# Patient Record
Sex: Male | Born: 1980 | Race: Asian | Hispanic: No | Marital: Single | Smoking: Former smoker
Health system: Southern US, Community
[De-identification: ages and names within clinical notes are randomized; demographics above are authoritative.]

## PROBLEM LIST (undated history)

## (undated) DIAGNOSIS — I639 Cerebral infarction, unspecified: Secondary | ICD-10-CM

## (undated) HISTORY — PX: NO PAST SURGERIES: SHX2092

---

## 2018-07-10 ENCOUNTER — Emergency Department (HOSPITAL_BASED_OUTPATIENT_CLINIC_OR_DEPARTMENT_OTHER): Payer: Self-pay

## 2018-07-10 ENCOUNTER — Inpatient Hospital Stay (HOSPITAL_BASED_OUTPATIENT_CLINIC_OR_DEPARTMENT_OTHER)
Admission: EM | Admit: 2018-07-10 | Discharge: 2018-07-11 | DRG: 065 | Disposition: A | Discharge status: Home / Self Care | Payer: Self-pay | Attending: Internal Medicine | Admitting: Internal Medicine

## 2018-07-10 ENCOUNTER — Other Ambulatory Visit: Payer: Self-pay

## 2018-07-10 ENCOUNTER — Encounter (HOSPITAL_BASED_OUTPATIENT_CLINIC_OR_DEPARTMENT_OTHER): Payer: Self-pay | Admitting: *Deleted

## 2018-07-10 ENCOUNTER — Inpatient Hospital Stay (HOSPITAL_COMMUNITY): Payer: Self-pay

## 2018-07-10 DIAGNOSIS — I63541 Cerebral infarction due to unspecified occlusion or stenosis of right cerebellar artery: Principal | ICD-10-CM | POA: Diagnosis present

## 2018-07-10 DIAGNOSIS — I10 Essential (primary) hypertension: Secondary | ICD-10-CM | POA: Diagnosis present

## 2018-07-10 DIAGNOSIS — E785 Hyperlipidemia, unspecified: Secondary | ICD-10-CM | POA: Diagnosis present

## 2018-07-10 DIAGNOSIS — R4781 Slurred speech: Secondary | ICD-10-CM | POA: Diagnosis present

## 2018-07-10 DIAGNOSIS — G8193 Hemiplegia, unspecified affecting right nondominant side: Secondary | ICD-10-CM | POA: Diagnosis present

## 2018-07-10 DIAGNOSIS — F1510 Other stimulant abuse, uncomplicated: Secondary | ICD-10-CM | POA: Diagnosis present

## 2018-07-10 DIAGNOSIS — F17210 Nicotine dependence, cigarettes, uncomplicated: Secondary | ICD-10-CM | POA: Diagnosis present

## 2018-07-10 DIAGNOSIS — I639 Cerebral infarction, unspecified: Secondary | ICD-10-CM

## 2018-07-10 DIAGNOSIS — R29702 NIHSS score 2: Secondary | ICD-10-CM | POA: Diagnosis present

## 2018-07-10 HISTORY — DX: Cerebral infarction, unspecified: I63.9

## 2018-07-10 LAB — CBC WITH DIFFERENTIAL/PLATELET
Abs Immature Granulocytes: 0.01 10*3/uL (ref 0.00–0.07)
Basophils Absolute: 0.1 10*3/uL (ref 0.0–0.1)
Basophils Relative: 1 %
Eosinophils Absolute: 0.2 10*3/uL (ref 0.0–0.5)
Eosinophils Relative: 3 %
HEMATOCRIT: 40.7 % (ref 39.0–52.0)
Hemoglobin: 12.7 g/dL — ABNORMAL LOW (ref 13.0–17.0)
Immature Granulocytes: 0 %
LYMPHS ABS: 1.6 10*3/uL (ref 0.7–4.0)
Lymphocytes Relative: 23 %
MCH: 24.4 pg — ABNORMAL LOW (ref 26.0–34.0)
MCHC: 31.2 g/dL (ref 30.0–36.0)
MCV: 78.3 fL — AB (ref 80.0–100.0)
Monocytes Absolute: 0.6 10*3/uL (ref 0.1–1.0)
Monocytes Relative: 9 %
Neutro Abs: 4.4 10*3/uL (ref 1.7–7.7)
Neutrophils Relative %: 64 %
Platelets: 336 10*3/uL (ref 150–400)
RBC: 5.2 MIL/uL (ref 4.22–5.81)
RDW: 13.8 % (ref 11.5–15.5)
WBC: 6.9 10*3/uL (ref 4.0–10.5)
nRBC: 0 % (ref 0.0–0.2)

## 2018-07-10 LAB — COMPREHENSIVE METABOLIC PANEL
ALT: 51 U/L — ABNORMAL HIGH (ref 0–44)
AST: 33 U/L (ref 15–41)
Albumin: 3.8 g/dL (ref 3.5–5.0)
Alkaline Phosphatase: 55 U/L (ref 38–126)
Anion gap: 7 (ref 5–15)
BILIRUBIN TOTAL: 0.6 mg/dL (ref 0.3–1.2)
BUN: 24 mg/dL — ABNORMAL HIGH (ref 6–20)
CO2: 25 mmol/L (ref 22–32)
Calcium: 8.3 mg/dL — ABNORMAL LOW (ref 8.9–10.3)
Chloride: 104 mmol/L (ref 98–111)
Creatinine, Ser: 1.25 mg/dL — ABNORMAL HIGH (ref 0.61–1.24)
GFR calc Af Amer: >60 mL/min (ref >60)
GFR calc non Af Amer: >60 mL/min (ref >60)
Glucose, Bld: 99 mg/dL (ref 70–99)
Potassium: 3.8 mmol/L (ref 3.5–5.1)
Sodium: 136 mmol/L (ref 135–145)
TOTAL PROTEIN: 7.5 g/dL (ref 6.5–8.1)

## 2018-07-10 LAB — TROPONIN I: Troponin I: <0.03 ng/mL (ref <0.03)

## 2018-07-10 LAB — ANTITHROMBIN III: AntiThromb III Func: 102 % (ref 75–120)

## 2018-07-10 MED ORDER — ASPIRIN 300 MG RE SUPP: 300.0000 mg | Freq: Every day | RECTAL | Status: DC

## 2018-07-10 MED ORDER — ACETAMINOPHEN 325 MG PO TABS: 650.0000 mg | ORAL_TABLET | ORAL | Status: DC | PRN

## 2018-07-10 MED ORDER — ASPIRIN 325 MG PO TABS
325.0000 mg | ORAL_TABLET | Freq: Every day | ORAL | Status: DC
  Administered 2018-07-10 – 2018-07-11 (×2): 325 mg via ORAL
  Filled 2018-07-10 (×2): qty 1

## 2018-07-10 MED ORDER — ENOXAPARIN SODIUM 40 MG/0.4ML ~~LOC~~ SOLN
40.0000 mg | SUBCUTANEOUS | Status: DC
  Administered 2018-07-10: 40 mg via SUBCUTANEOUS
  Filled 2018-07-10: qty 0.4

## 2018-07-10 MED ORDER — ACETAMINOPHEN 650 MG RE SUPP: 650.0000 mg | RECTAL | Status: DC | PRN

## 2018-07-10 MED ORDER — SENNOSIDES-DOCUSATE SODIUM 8.6-50 MG PO TABS: 1.0000 | ORAL_TABLET | Freq: Every evening | ORAL | Status: DC | PRN

## 2018-07-10 MED ORDER — IOPAMIDOL (ISOVUE-370) INJECTION 76%
INTRAVENOUS | Status: AC
  Filled 2018-07-10: qty 100

## 2018-07-10 MED ORDER — ASPIRIN 81 MG PO CHEW
324.0000 mg | CHEWABLE_TABLET | Freq: Once | ORAL | Status: AC
  Administered 2018-07-10: 324 mg via ORAL
  Filled 2018-07-10: qty 4

## 2018-07-10 MED ORDER — ATORVASTATIN CALCIUM 80 MG PO TABS
80.0000 mg | ORAL_TABLET | Freq: Every day | ORAL | Status: DC
  Administered 2018-07-10: 80 mg via ORAL
  Filled 2018-07-10: qty 1

## 2018-07-10 MED ORDER — IOPAMIDOL (ISOVUE-370) INJECTION 76%
100.0000 mL | Freq: Once | INTRAVENOUS | Status: AC | PRN
  Administered 2018-07-10: 75 mL via INTRAVENOUS

## 2018-07-10 MED ORDER — ACETAMINOPHEN 160 MG/5ML PO SOLN: 650.0000 mg | ORAL | Status: DC | PRN

## 2018-07-10 MED ORDER — STROKE: EARLY STAGES OF RECOVERY BOOK
Freq: Once | Status: AC
  Administered 2018-07-10: 20:00:00
  Filled 2018-07-10: qty 1

## 2018-07-10 NOTE — ED Provider Notes (Signed)
[facility] EMERGENCY DEPARTMENT Provider Note   CSN: [id number] Arrival date & time: 07/10/18  1314     History   Chief Complaint Chief Complaint  Patient presents with  . Numbness    HPI Lavontay Kirk is a 37 y.o. male.  Pt presents to the ED today with right sided weakness and headache.  The pt initially developed a headache, slurred speech, and right sided weakness about 2 weeks ago.  The pt said speech improved and the right sided weakness has improved, but it is still not back to normal.  Pt does not have a pcp and has not seen the doctor in years.  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  This is what finally prompted him to come into the ED.     History reviewed. No pertinent past medical history.  There are no active problems to display for this patient.   History reviewed. No pertinent surgical history.      Home Medications    Prior to Admission medications   Not on File    Family History No family history on file.  Social History Social History   Tobacco Use  . Smoking status: Never Smoker  Substance Use Topics  . Alcohol use: Not Currently  . Drug use: Not Currently     Allergies   Patient has no known allergies.   Review of Systems Review of Systems  Neurological:       Slurred speech, right arm weakness  All other systems reviewed and are negative.    Physical Exam Updated Vital Signs BP 120/89   Pulse 87   Temp 98.3 F (36.8 C) (Oral)   Resp (!) 22   Ht 5\' 7"  (1.702 m)   Wt 52.2 kg   SpO2 100%   BMI 18.01 kg/m   Physical Exam  Constitutional: He is oriented to person, place, and time. He appears well-developed and well-nourished.  HENT:  Head: Normocephalic and atraumatic.  Right Ear: External ear normal.  Left Ear: External ear normal.  Nose: Nose normal.  Mouth/Throat: Oropharynx is clear and moist.  Eyes: Pupils are equal, round, and reactive to light.  Conjunctivae and EOM are normal.  Neck: Normal range of motion. Neck supple.  Cardiovascular: Normal rate, regular rhythm, normal heart sounds and intact distal pulses.  Pulmonary/Chest: Effort normal and breath sounds normal.  Abdominal: Soft. Bowel sounds are normal.  Musculoskeletal: Normal range of motion.  Neurological: He is alert and oriented to person, place, and time.  Skin: Skin is warm. Capillary refill takes less than 2 seconds.  Psychiatric: He has a normal mood and affect. His behavior is normal. Judgment and thought content normal.  Nursing note and vitals reviewed.    ED Treatments / Results  Labs (all labs ordered are listed, but only abnormal results are displayed) Labs Reviewed  CBC WITH DIFFERENTIAL/PLATELET - Abnormal; Notable for the following components:      Result Value   Hemoglobin 12.7 (*)    MCV 78.3 (*)    MCH 24.4 (*)    All other components within normal limits  COMPREHENSIVE METABOLIC PANEL - Abnormal; Notable for the following components:   BUN 24 (*)    Creatinine, Ser 1.25 (*)    Calcium 8.3 (*)    ALT 51 (*)    All other components within normal limits  TROPONIN I    EKG EKG Interpretation  Date/Time:  Wednesday July 10 2018 13:25:41 EST Ventricular Rate:  100 PR Interval:  142 QRS Duration: 108 QT Interval:  352 QTC Calculation: 454 R Axis:   82 Text Interpretation:  Normal sinus rhythm Normal ECG Confirmed by Jacalyn LefevreHaviland, Donnamaria Shands [phone]([id number]) on 07/10/2018 1:29:21 PM   Radiology Ct Head Wo Contrast  Result Date: 07/10/2018 CLINICAL DATA:  Initial evaluation for acute headache, slurred speech, right-sided facial numbness and weakness. EXAM: CT HEAD WITHOUT CONTRAST TECHNIQUE: Contiguous axial images were obtained from the base of the skull through the vertex without intravenous contrast. COMPARISON:  None. FINDINGS: Brain: Cerebral volume within normal limits for age. No acute intracranial hemorrhage. There is abnormal which a parenchymal  hypodensity involving the superior aspect of the right cerebellar hemisphere, consistent with acute to early subacute ischemic infarct, right SCA territory (series 4, image 47). Additionally, there is question of subtle evolving hypodensity within the anterior left temporal lobe, which could reflect evolving acute ischemia as well (series 2, image 10). No associated mass effect or hemorrhage. No other definite acute large vessel territory infarct. No mass lesion, midline shift or mass effect. No hydrocephalus. No extra-axial fluid collection. Vascular: Question subtle asymmetric hyperdensity within proximal left M2 branch at the base of the left sylvian fissure (series 2, image 12). No other appreciable abnormal hyperdense vessel. Skull: Scalp soft tissues demonstrate no acute abnormality. Calvarium intact. Sinuses/Orbits: Globes and orbital soft tissues within normal limits. Visualized paranasal sinuses are clear. No mastoid effusion. IMPRESSION: 1. Abnormal hypodensity involving the superior right cerebellar hemisphere, consistent with acute to early subacute ischemic infarct, right superior cerebellar artery territory. 2. Question subtle evolving hypodensity within the anterior left temporal lobe, which could also reflect evolving acute ischemic change. 3. Question subtle asymmetric hyperdensity involving proximal left M2 branch as above. Further assessment with dedicated CTA of the head and neck suggested for further evaluation. 4. No intracranial hemorrhage. Critical Value/emergent results were called by telephone at the time of interpretation on 07/10/2018 at 3:15 pm to Dr. Jacalyn LefevreJULIE Caden Fukushima , who verbally acknowledged these results. Electronically Signed   By: Rise MuBenjamin  McClintock M.D.   On: 07/10/2018 15:20    Procedures Procedures (including critical care time)  Medications Ordered in ED Medications  aspirin chewable tablet 324 mg (324 mg Oral Given 07/10/18 1523)     Initial Impression / Assessment  and Plan / ED Course  I have reviewed the triage vital signs and the nursing notes.  Pertinent labs & imaging results that were available during my care of the patient were reviewed by me and considered in my medical decision making (see chart for details).    Radiologist was concerned about a thrombus and early to subacute infarct in the right superior cerebellar artery territory.  No left sided sx now.  Pt also has a possible thrombus in the proximal left M2 branch.  Recommend CTA head and neck.  Pt d/w neurology who recommends ED to ED transfer so they can get a CTA there and intervention if needed.  Call neurology upon arrival.  Pt d/w Dr. Lockie Molauratolo (ED attending) who will accept transfer.   [facility]  Final Clinical Impressions(s) / ED Diagnoses   Final diagnoses:  Cerebrovascular accident (CVA), unspecified mechanism [facility](HCC)    ED Discharge Orders    None       Jacalyn LefevreHaviland, Engelbert Sevin, MD 07/10/18 1535

## 2018-07-10 NOTE — ED Provider Notes (Signed)
37 yo M with a chief complaint of right-sided weakness.  The patient had an event about 2 weeks ago where he felt like he could not move the right side of his body and was unable to get up out of bed.  Is lasted for a couple days then resolved.  He is gotten progressively better since then.  He got fired from his job because he is no longer able to hold onto things with his right hand.  [facility] went [facility] and was transferred here for evaluation by neurologist.  He was seen by Dr. Jerrell BelfastAurora, neurology he recommended a CT angiogram of the head and neck and recommended medical admission for further stroke work-up.   Melene PlanFloyd, Vilma Will, DO 07/10/18 [phone]

## 2018-07-10 NOTE — ED Notes (Signed)
[facility] by [facility]. Pt alert, no c/o pain. Vss.

## 2018-07-10 NOTE — ED Notes (Signed)
EMS enroute for pt transport.

## 2018-07-10 NOTE — ED Triage Notes (Addendum)
Pt c/o  H/a ,slurred speech and right side numbness and weakness x 2 weeks ago , pt states speech improved and right side weakness improved over 2 weeks .

## 2018-07-10 NOTE — ED Notes (Signed)
Patient transported to CT 

## 2018-07-10 NOTE — Consult Note (Addendum)
Neurology Consultation  Reason for Consult: Incoordination of right hand and slurred speech Referring Physician: Adela Lank  CC: Incoordination of right hand slurred speech  History is obtained from: Patient  HPI: Logan Trevino is a 37 y.o. male with no significant past medical history however he does state that he does vape.  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  Approximately 2 weeks ago he noted that he was clumsy with his right hand and had problems with coordination.  He also noted that he was having difficulty with his speech i.e. slurring of speech-off and on.  He states he did not come to the hospital because he thought he had slept on his arm the wrong way.  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   He denies doing any drugs.  LKW: 2 weeks ago tpa given?: no, out of window Premorbid modified Rankin scale (mRS): 0 NIH stroke score: 2  ROS: ROS was performed and is negative except as noted in the HPI.   History reviewed. No pertinent past medical history.  No family history on file. No family history of strokes or MI in young.  Social History:   reports that he has never smoked. He does not have any smokeless tobacco history on file. He reports that he drank alcohol. He reports that he has current or past drug history.  Medications No current facility-administered medications for this encounter.  No current outpatient medications on file. None  Exam: Current vital signs: BP 120/84 (BP Location: Left Arm)   Pulse 87   Temp 98.3 F (36.8 C) (Oral)   Resp 16   Ht 5\' 7"  (1.702 m)   Wt 52.2 kg   SpO2 100%   BMI 18.01 kg/m  Vital signs in last 24 hours: Temp:  [98.2 F (36.8 C)-98.3 F  (36.8 C)] 98.3 F (36.8 C) (12/04 1532) Pulse Rate:  [87-102] 87 (12/04 1530) Resp:  [16-22] 16 (12/04 1552) BP: (120-140)/(84-103) 120/84 (12/04 1552) SpO2:  [98 %-100 %] 100 % (12/04 1552) Weight:  [52.2 kg] 52.2 kg (12/04 1323)  Physical Exam  Constitutional: Appears well-developed and well-nourished.  Psych: Affect appropriate to situation Eyes: No scleral injection HENT: No OP obstrucion Head: Normocephalic.  Cardiovascular: Normal rate and regular rhythm.  Respiratory: Effort normal, non-labored breathing GI: Soft.  No distension. There is no tenderness.  Skin: WDI  Neuro: Mental Status: Patient is awake, alert, oriented to person, place, month, year, and situation. Patient is able to give a clear and coherent history. No signs of aphasia or neglect however he is mildly dysarthric Cranial Nerves: II: Visual Fields are full.  III,IV, VI: EOMI without ptosis or diploplia. Pupils are equal, round, and reactive to light.   V: Facial sensation is symmetric to temperature VII: Facial movement is symmetric.  VIII: hearing is intact to voice X: Uvula elevates symmetrically XI: Shoulder shrug is symmetric. XII: tongue is midline without atrophy or fasciculations.  Motor: Tone is normal. Bulk is normal. 5/5 strength on the left arm and bilateral legs.  He is a 4+/5 on the right arm Sensory: Sensation is symmetric to light touch and temperature in the arms and legs. Deep Tendon Reflexes:  2+ and symmetric in the biceps and patellae.  Plantars: Toes are downgoing bilaterally.  Cerebellar: Heel-to-shin was within normal limits b/l, left finger-nose was within normal limits, right finger-nose showed dysmetria  Labs I have reviewed labs in epic and the results pertinent to this consultation are:   CBC    Component Value Date/Time   WBC 6.9 07/10/2018 1328   RBC 5.20 07/10/2018 1328   HGB 12.7 (L) 07/10/2018 1328   HCT 40.7 07/10/2018 1328   PLT 336 07/10/2018 1328   MCV  78.3 (L) 07/10/2018 1328   MCH 24.4 (L) 07/10/2018 1328   MCHC 31.2 07/10/2018 1328   RDW 13.8 07/10/2018 1328   LYMPHSABS 1.6 07/10/2018 1328   MONOABS 0.6 07/10/2018 1328   EOSABS 0.2 07/10/2018 1328   BASOSABS 0.1 07/10/2018 1328    CMP     Component Value Date/Time   NA 136 07/10/2018 1328   K 3.8 07/10/2018 1328   CL 104 07/10/2018 1328   CO2 25 07/10/2018 1328   GLUCOSE 99 07/10/2018 1328   BUN 24 (H) 07/10/2018 1328   CREATININE 1.25 (H) 07/10/2018 1328   CALCIUM 8.3 (L) 07/10/2018 1328   PROT 7.5 07/10/2018 1328   ALBUMIN 3.8 07/10/2018 1328   AST 33 07/10/2018 1328   ALT 51 (H) 07/10/2018 1328   ALKPHOS 55 07/10/2018 1328   BILITOT 0.6 07/10/2018 1328   GFRNONAA >60 07/10/2018 1328   GFRAA >60 07/10/2018 1328   Imaging I have reviewed the images obtained: CT-scan of the brain- hypodensity of the superior right cerebellar hemisphere, consistent with subacute ischemic infarct in the right superior cerebellar artery territory. Possible dense Lt M2 (question if this is real or artifactual as the other side is also hyperdense). Possible LMCA stroke evolving.  Felicie Mornavid Smith PA-C [facility] [phone]  M-F  (9:00 am- 5:00 PM)  07/10/2018, 4:48 PM   Assessment:  37 year old male with a subacute (1092-week old) right cerebellar infarct and possible left MCA infarct. Due to his young age, further work up for atypical causes of stroke such as hypercoagulable states, dissection as well as illicit drug use (which he denies) should be done, in addition to regular stroke risk factor work up.  Not a candidate for IV-tPA or EVT because of LSN 2 weeks ago.  Impression: -Subacute Right cerebellar infarct --Concern for left MCA infarct  Recommendations: -CTA of head and neck -MRI of brain -Echocardiogram - Hemoglobin A1c and lipid panel -Speech therapy -PT/OT - Begin Atorvastatin 80 mg/other high intensity statin -Start patient on aspirin 325 mg daily -  Telemetry monitoring -N.p.o. until passes stroke swallow screen -Frequent neuro checks -Hypercoagulable panel --U tox  # please page stroke NP  Or  PA  Or MD from 8am -4 pm  as this patient from this time will be  followed by the stroke.   You can look them up on [email or web address]  Password [facility]  Attending Neurohospitalist Addendum Patient seen and examined with APP/Resident. Agree with the history and physical as documented above. Agree with the plan as documented, which I helped formulate and have edited the note above. I have independently reviewed the chart, obtained history, review of systems and examined the patient.I have personally reviewed pertinent head/neck/spine imaging (CT/MRI). Please feel free to call with any questions. --- Milon DikesAshish Oakes Mccready, MD [facility] Pager: [phone]  If 7pm to 7am, please call on call as listed on AMION.

## 2018-07-10 NOTE — ED Notes (Signed)
ED Provider at bedside. 

## 2018-07-10 NOTE — H&P (Signed)
[facility]                                                                                    Patient Demographics  Logan Trevino, is a 37 y.o. male  CSN: [id number]  MRN: [id number]  DOB - 23-May-1981  Admit Date - 07/10/2018  Outpatient Primary MD for the patient is Patient, No Pcp Per   With History of -  History reviewed. No pertinent past medical history.    History reviewed. No pertinent surgical history.  in for   Chief Complaint  Patient presents with  . Code Stroke     HPI  Logan Trevino  is a 37 y.o. male, with no significant past medical history, presenting with 2 weeks history of right-sided weakness and problems with coordination at work.  He reports slurring of speech and right-sided weakness that has been improving over the last 2 weeks.  Patient did not seek any medical help until he was pressured by his family and friends to do that.  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  His mother was in the room with him and she reports that 2 weeks ago she had difficulty waking him up and he refused to see a physician at that time.  Patient reports smoking marijuana at times but denies other drugs categorically .  The patient still has some numbness in his right hand.  In the emergency room patient had a CT of the head which was consistent with right superior cerebellar artery territory CVA.    Review of Systems    In addition to the HPI above,  No Fever-chills, No Headache, No changes with Vision or hearing, No problems swallowing food or Liquids, No Chest pain, Cough or Shortness of Breath, No Abdominal pain, No Nausea or Vommitting, Bowel movements are regular, No Blood in stool or Urine, No dysuria, No new skin rashes or bruises, No new joints pains-aches,  No recent weight gain or loss, No polyuria, polydypsia or polyphagia, No significant Mental  Stressors.  A full 10 point Review of Systems was done, except as stated above, all other Review of Systems were negative.   Social History Social History   Tobacco Use  . Smoking status: Never Smoker  Substance Use Topics  . Alcohol use: Not Currently     Family History Significant for hypertension  Prior to Admission medications   Not on File    No Known Allergies  Physical Exam  Vitals  Blood pressure 120/84, pulse 87, temperature 98.3 F (36.8 C), temperature source Oral, resp. rate 16, height 5\' 7"  (1.702 m), weight 52.2 kg, SpO2 100 %.   1. General well-developed, well-nourished male, very pleasant  2.  Inappropriate affect and insight, Not Suicidal or Homicidal, Awake Alert, Oriented X 3.  3. No F.N deficits, except for abnormal right-sided finger-to-nose test.  4. Ears and Eyes appear Normal, Conjunctivae clear, PERRLA. Moist Oral Mucosa.  5. Supple Neck, No JVD, No cervical lymphadenopathy appriciated, No Carotid Bruits.  6. Symmetrical Chest wall movement, Good air movement bilaterally, CTAB.  7. RRR,  No Gallops, Rubs or Murmurs, No Parasternal Heave.  8. Positive Bowel Sounds, Abdomen Soft, Non tender, No organomegaly appriciated,No rebound -guarding or rigidity.  9.  No Cyanosis, Normal Skin Turgor, No Skin Rash or Bruise.  10. Good muscle tone,  joints appear normal , no effusions, Normal ROM.    Data Review  CBC Recent Labs  Lab 07/10/18 1328  WBC 6.9  HGB 12.7*  HCT 40.7  PLT 336  MCV 78.3*  MCH 24.4*  MCHC 31.2  RDW 13.8  LYMPHSABS 1.6  MONOABS 0.6  EOSABS 0.2  BASOSABS 0.1   ------------------------------------------------------------------------------------------------------------------  Chemistries  Recent Labs  Lab 07/10/18 1328  NA 136  K 3.8  CL 104  CO2 25  GLUCOSE 99  BUN 24*  CREATININE 1.25*  CALCIUM 8.3*  AST 33  ALT 51*  ALKPHOS 55  BILITOT 0.6    ------------------------------------------------------------------------------------------------------------------ estimated creatinine clearance is 60.3 mL/min (A) (by C-G formula based on SCr of 1.25 mg/dL (H)). ------------------------------------------------------------------------------------------------------------------ No results for input(s): TSH, T4TOTAL, T3FREE, THYROIDAB in the last 72 hours.  Invalid input(s): FREET3   Coagulation profile No results for input(s): INR, PROTIME in the last 168 hours. ------------------------------------------------------------------------------------------------------------------- No results for input(s): DDIMER in the last 72 hours. -------------------------------------------------------------------------------------------------------------------  Cardiac Enzymes Recent Labs  Lab 07/10/18 1328  TROPONINI <0.03   ------------------------------------------------------------------------------------------------------------------ Invalid input(s): POCBNP   ---------------------------------------------------------------------------------------------------------------  Urinalysis No results found for: COLORURINE, APPEARANCEUR, LABSPEC, PHURINE, GLUCOSEU, HGBUR, BILIRUBINUR, KETONESUR, PROTEINUR, UROBILINOGEN, NITRITE, LEUKOCYTESUR  ----------------------------------------------------------------------------------------------------------------   Imaging results:   Ct Head Wo Contrast  Result Date: 07/10/2018 CLINICAL DATA:  Initial evaluation for acute headache, slurred speech, right-sided facial numbness and weakness. EXAM: CT HEAD WITHOUT CONTRAST TECHNIQUE: Contiguous axial images were obtained from the base of the skull through the vertex without intravenous contrast. COMPARISON:  None. FINDINGS: Brain: Cerebral volume within normal limits for age. No acute intracranial hemorrhage. There is abnormal which a parenchymal hypodensity  involving the superior aspect of the right cerebellar hemisphere, consistent with acute to early subacute ischemic infarct, right SCA territory (series 4, image 47). Additionally, there is question of subtle evolving hypodensity within the anterior left temporal lobe, which could reflect evolving acute ischemia as well (series 2, image 10). No associated mass effect or hemorrhage. No other definite acute large vessel territory infarct. No mass lesion, midline shift or mass effect. No hydrocephalus. No extra-axial fluid collection. Vascular: Question subtle asymmetric hyperdensity within proximal left M2 branch at the base of the left sylvian fissure (series 2, image 12). No other appreciable abnormal hyperdense vessel. Skull: Scalp soft tissues demonstrate no acute abnormality. Calvarium intact. Sinuses/Orbits: Globes and orbital soft tissues within normal limits. Visualized paranasal sinuses are clear. No mastoid effusion. IMPRESSION: 1. Abnormal hypodensity involving the superior right cerebellar hemisphere, consistent with acute to early subacute ischemic infarct, right superior cerebellar artery territory. 2. Question subtle evolving hypodensity within the anterior left temporal lobe, which could also reflect evolving acute ischemic change. 3. Question subtle asymmetric hyperdensity involving proximal left M2 branch as above. Further assessment with dedicated CTA of the head and neck suggested for further evaluation. 4. No intracranial hemorrhage. Critical Value/emergent results were called by telephone at the time of interpretation on 07/10/2018 at 3:15 pm to Dr. Jacalyn Lefevre , who verbally acknowledged these results. Electronically Signed   By: Rise Mu M.D.   On: 07/10/2018 15:20    My personal review of EKG: Rhythm NSR, at 100 bpm    Assessment & Plan  Right cerebellar CVA;  beyond the window of TPA  Plan Check CTA head and neck MRI/MRA Echocardiogram Check hemoglobin A1c and lipid  profile Lipitor/aspirin N.p.o. until cleared by speech therapy PT/OT Check urine drug screen Frequent neuro checks  DVT Prophylaxis Lovenox  AM Labs Ordered, also please review Full Orders  Family Communication: Admission, patients condition and plan of care including tests being ordered have been discussed with the patient and mother who indicate understanding and agree with the plan and Code Status.  Code Status full  Disposition Plan: Home  Time spent in minutes : 34 minutes  Condition GUARDED   @SIGNATURE @

## 2018-07-10 NOTE — ED Notes (Signed)
Pt transferred from [facility] @ this time

## 2018-07-11 ENCOUNTER — Other Ambulatory Visit: Payer: Self-pay | Admitting: Neurology

## 2018-07-11 ENCOUNTER — Inpatient Hospital Stay (HOSPITAL_COMMUNITY): Payer: Self-pay

## 2018-07-11 ENCOUNTER — Encounter (HOSPITAL_COMMUNITY): Payer: Self-pay | Admitting: General Practice

## 2018-07-11 DIAGNOSIS — I639 Cerebral infarction, unspecified: Secondary | ICD-10-CM

## 2018-07-11 DIAGNOSIS — Z720 Tobacco use: Secondary | ICD-10-CM

## 2018-07-11 DIAGNOSIS — Q211 Atrial septal defect: Secondary | ICD-10-CM

## 2018-07-11 DIAGNOSIS — F1510 Other stimulant abuse, uncomplicated: Secondary | ICD-10-CM

## 2018-07-11 DIAGNOSIS — E785 Hyperlipidemia, unspecified: Secondary | ICD-10-CM

## 2018-07-11 DIAGNOSIS — I340 Nonrheumatic mitral (valve) insufficiency: Secondary | ICD-10-CM

## 2018-07-11 DIAGNOSIS — R29898 Other symptoms and signs involving the musculoskeletal system: Secondary | ICD-10-CM

## 2018-07-11 DIAGNOSIS — I10 Essential (primary) hypertension: Secondary | ICD-10-CM

## 2018-07-11 DIAGNOSIS — I63541 Cerebral infarction due to unspecified occlusion or stenosis of right cerebellar artery: Principal | ICD-10-CM

## 2018-07-11 DIAGNOSIS — I63341 Cerebral infarction due to thrombosis of right cerebellar artery: Secondary | ICD-10-CM

## 2018-07-11 LAB — CARDIOLIPIN ANTIBODIES, IGG, IGM, IGA
Anticardiolipin IgA: <9 APL U/mL (ref 0–11)
Anticardiolipin IgG: <9 GPL U/mL (ref 0–14)
Anticardiolipin IgM: <9 MPL U/mL (ref 0–12)

## 2018-07-11 LAB — HEMOGLOBIN A1C
HEMOGLOBIN A1C: 6 % — AB (ref 4.8–5.6)
Mean Plasma Glucose: 125.5 mg/dL

## 2018-07-11 LAB — PROTEIN S ACTIVITY: Protein S Activity: 109 % (ref 63–140)

## 2018-07-11 LAB — TSH: TSH: 0.327 u[IU]/mL — ABNORMAL LOW (ref 0.350–4.500)

## 2018-07-11 LAB — RAPID URINE DRUG SCREEN, HOSP PERFORMED
Amphetamines: POSITIVE — AB
Barbiturates: NOT DETECTED
Benzodiazepines: NOT DETECTED
Cocaine: NOT DETECTED
OPIATES: NOT DETECTED
Tetrahydrocannabinol: NOT DETECTED

## 2018-07-11 LAB — LIPID PANEL
Cholesterol: 219 mg/dL — ABNORMAL HIGH (ref 0–200)
HDL: 51 mg/dL (ref >40)
LDL Cholesterol: 147 mg/dL — ABNORMAL HIGH (ref 0–99)
Total CHOL/HDL Ratio: 4.3 RATIO
Triglycerides: 105 mg/dL (ref <150)
VLDL: 21 mg/dL (ref 0–40)

## 2018-07-11 LAB — PROTEIN S, TOTAL: Protein S Ag, Total: 90 % (ref 60–150)

## 2018-07-11 LAB — HIV ANTIBODY (ROUTINE TESTING W REFLEX): HIV Screen 4th Generation wRfx: NONREACTIVE

## 2018-07-11 LAB — LUPUS ANTICOAGULANT PANEL
DRVVT: 36.6 s (ref 0.0–47.0)
PTT Lupus Anticoagulant: 40.8 s (ref 0.0–51.9)

## 2018-07-11 LAB — C-REACTIVE PROTEIN: CRP: <0.8 mg/dL (ref <1.0)

## 2018-07-11 LAB — ECHOCARDIOGRAM COMPLETE
HEIGHTINCHES: 67 in
Weight: 1840 oz

## 2018-07-11 LAB — HOMOCYSTEINE: Homocysteine: 8.7 umol/L (ref 0.0–15.0)

## 2018-07-11 LAB — SEDIMENTATION RATE: Sed Rate: 11 mm/hr (ref 0–16)

## 2018-07-11 LAB — PROTEIN C ACTIVITY: Protein C Activity: 96 % (ref 73–180)

## 2018-07-11 LAB — RPR: RPR Ser Ql: NONREACTIVE

## 2018-07-11 LAB — T4, FREE: Free T4: 0.84 ng/dL (ref 0.82–1.77)

## 2018-07-11 LAB — VITAMIN B12: VITAMIN B 12: 443 pg/mL (ref 180–914)

## 2018-07-11 MED ORDER — CLOPIDOGREL BISULFATE 75 MG PO TABS: 75.0000 mg | ORAL_TABLET | Freq: Every day | ORAL | 0 refills | Status: AC

## 2018-07-11 MED ORDER — ATORVASTATIN CALCIUM 40 MG PO TABS
40.0000 mg | ORAL_TABLET | Freq: Every day | ORAL | Status: DC
  Administered 2018-07-11: 40 mg via ORAL
  Filled 2018-07-11: qty 1

## 2018-07-11 MED ORDER — ASPIRIN 325 MG PO TABS: 325.0000 mg | ORAL_TABLET | Freq: Every day | ORAL | 0 refills | Status: AC

## 2018-07-11 MED ORDER — ATORVASTATIN CALCIUM 40 MG PO TABS: 40.0000 mg | ORAL_TABLET | Freq: Every day | ORAL | 0 refills | Status: AC

## 2018-07-11 NOTE — Discharge Summary (Signed)
Physician Discharge Summary  Logan Trevino ZOX:[id number] DOB: March 24, 1981 DOA: 07/10/2018  PCP: Patient, No Pcp Per  Admit date: 07/10/2018 Discharge date: 07/11/2018  Admitted From: Home Disposition:  Home  Discharge Condition:Stable CODE STATUS:FULL Diet recommendation: Heart Healthy  Brief/Interim Summary:  Admission H and P:  HPI  Logan Trevino  is a 37 y.o. male, with no significant past medical history, presenting with 2 weeks history of right-sided weakness and problems with coordination at work.  He reports slurring of speech and right-sided weakness that has been improving over the last 2 weeks.  Patient did not seek any medical help until he was pressured by his family and friends to do that.  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  His mother was in the room with him and she reports that 2 weeks ago she had difficulty waking him up and he refused to see a physician at that time.  Patient reports smoking marijuana at times but denies other drugs categorically .  The patient still has some numbness in his right hand In the emergency room   Hospital course: MRI brain done  which was consistent with right superior cerebellar artery territory CVA. Neurology  consulted.  Work-up completed. He is stable for discharge to home today.  Following problems were addressed during his hospitalization:  Acute ischemic stroke:MRI head showed Subacute RIGHT superior cerebellar artery territory infarcts.CTA head and neck showed patent carotid and vertebral arteries. Seen by occupational therapist and recommended outpatient OT. Echocardiogram was done which showed ejection fraction of 50%, diffuse hypokinesis.  Transcranial Doppler with bubble study showed a small to medium sized PFO. He will follow-up with cardiology for TEE as an outpatient and also for possible PFO closure. He has been started on aspirin and  Plavix which we will continue for 3 weeks and then continue on aspirin only.  Started on Lipitor. His LDL was 147.  Hemoglobin C was 6. His UDS was positive for amphetamines. He will follow-up with neurology as an outpatient.  Drug abuse: Counseled for cessation.  No PCP/lack of insurance: Discussed with case manager, outpatient follow-up with PCP arranged.  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 2) Follow up with cardiology for outpatient transesophageal echocardiogram and possible patent foramen ovale closure. 3) Take prescribed medications as instructed. 4) Continue aspirin and Plavix for 3 weeks and then continue on aspirin only. 4)Follow up with outpatient physical therapy/Occupational Therapy. 5)Follow up with neurology as an outpatient.   Increase activity slowly   Complete by:  As directed      Allergies as of 07/11/2018   No Known Allergies     Medication List    TAKE these medications   aspirin 325 MG tablet Take 1 tablet (325 mg total) by mouth daily. Start taking on:  07/12/2018   atorvastatin 40 MG tablet Commonly known as:  LIPITOR Take 1 tablet (40 mg total) by mouth daily at 6 PM.   clopidogrel 75 MG tablet  Commonly known as:  PLAVIX Take 1 tablet (75 mg total) by mouth daily for 21 days.      Follow-up Information    [facility] Follow up on 07/23/2018.   Why:  Your appointment time is 9:20 am. Please arrive 15 min early. Please bring: picture ID, and current medications. Contact information: [address] [place], [place]  [address]  [phone]         No Known Allergies  Consultations:  neurology   Procedures/Studies: Ct Angio Head W Or Wo Contrast  Result Date: 07/10/2018 CLINICAL DATA:  37 y/o M; headache, slurred speech, and right-sided numbness with weakness 2 weeks ago. EXAM: CT ANGIOGRAPHY HEAD AND NECK TECHNIQUE: Multidetector CT imaging of the head and neck was performed using the standard protocol during bolus administration of intravenous contrast. Multiplanar CT image reconstructions and MIPs were obtained to evaluate the vascular anatomy. Carotid stenosis measurements (when applicable) are obtained utilizing NASCET criteria, using the distal internal carotid diameter as the denominator. CONTRAST:  75 cc Isovue 370 COMPARISON:  07/10/2018 CT head FINDINGS: CTA NECK FINDINGS Aortic arch: Standard branching. Imaged portion shows no evidence of aneurysm or dissection. No significant stenosis of the major arch vessel origins. Right carotid system: No evidence of dissection, stenosis (50% or greater) or occlusion. Left carotid system: No evidence of dissection, stenosis (50% or greater) or occlusion. Vertebral arteries: Codominant. No evidence of dissection, stenosis (50% or greater) or occlusion. Skeleton: Negative. Other neck: Negative. Upper chest: Negative. Review of the MIP images confirms the above findings CTA HEAD FINDINGS Anterior circulation: No significant stenosis, proximal occlusion, aneurysm, or vascular malformation. Posterior circulation: No significant stenosis, proximal occlusion, aneurysm, or vascular malformation. Venous sinuses: As permitted by contrast timing, patent. Anatomic variants: Anterior communicating artery and right posterior communicating arteries are patent. No left posterior communicating artery identified, likely hypoplastic or absent. Delayed phase: No abnormal intracranial enhancement. Review of the MIP images confirms the above findings IMPRESSION: 1. Patent carotid and  vertebral arteries. No dissection, aneurysm, or hemodynamically significant stenosis utilizing NASCET criteria. 2. Patent anterior and posterior intracranial circulation. No large vessel occlusion, aneurysm, or significant stenosis. Electronically Signed   By: Mitzi Hansen M.D.   On: 07/10/2018 18:55   Ct Head Wo Contrast  Result Date: 07/10/2018 CLINICAL DATA:  Initial evaluation for acute headache, slurred speech, right-sided facial numbness and weakness. EXAM: CT HEAD WITHOUT CONTRAST TECHNIQUE: Contiguous axial images were obtained from the base of the skull through the vertex without intravenous contrast. COMPARISON:  None. FINDINGS: Brain: Cerebral volume within normal limits for age. No acute intracranial hemorrhage. There is abnormal which a parenchymal hypodensity involving the superior aspect of the right cerebellar hemisphere, consistent with acute to early subacute ischemic infarct, right SCA territory (series 4, image 47). Additionally, there is question of subtle evolving hypodensity within the anterior left temporal lobe, which could reflect evolving acute ischemia as well (series 2, image 10). No associated mass effect or hemorrhage. No other definite acute large vessel territory infarct. No mass lesion, midline shift or mass effect. No hydrocephalus. No extra-axial fluid collection. Vascular: Question subtle asymmetric hyperdensity within proximal left M2 branch at the base of the left sylvian fissure (series 2, image 12). No other appreciable abnormal hyperdense vessel. Skull: Scalp soft tissues demonstrate no acute abnormality. Calvarium intact. Sinuses/Orbits: Globes and orbital soft tissues within normal limits. Visualized paranasal sinuses are clear. No mastoid effusion. IMPRESSION: 1. Abnormal hypodensity involving the superior right cerebellar hemisphere, consistent  with acute to early subacute ischemic infarct, right superior cerebellar artery territory. 2. Question subtle  evolving hypodensity within the anterior left temporal lobe, which could also reflect evolving acute ischemic change. 3. Question subtle asymmetric hyperdensity involving proximal left M2 branch as above. Further assessment with dedicated CTA of the head and neck suggested for further evaluation. 4. No intracranial hemorrhage. Critical Value/emergent results were called by telephone at the time of interpretation on 07/10/2018 at 3:15 pm to Dr. Jacalyn Lefevre , who verbally acknowledged these results. Electronically Signed   By: Rise Mu M.D.   On: 07/10/2018 15:20   Ct Angio Neck W And/or Wo Contrast  Result Date: 07/10/2018 CLINICAL DATA:  37 y/o M; headache, slurred speech, and right-sided numbness with weakness 2 weeks ago. EXAM: CT ANGIOGRAPHY HEAD AND NECK TECHNIQUE: Multidetector CT imaging of the head and neck was performed using the standard protocol during bolus administration of intravenous contrast. Multiplanar CT image reconstructions and MIPs were obtained to evaluate the vascular anatomy. Carotid stenosis measurements (when applicable) are obtained utilizing NASCET criteria, using the distal internal carotid diameter as the denominator. CONTRAST:  75 cc Isovue 370 COMPARISON:  07/10/2018 CT head FINDINGS: CTA NECK FINDINGS Aortic arch: Standard branching. Imaged portion shows no evidence of aneurysm or dissection. No significant stenosis of the major arch vessel origins. Right carotid system: No evidence of dissection, stenosis (50% or greater) or occlusion. Left carotid system: No evidence of dissection, stenosis (50% or greater) or occlusion. Vertebral arteries: Codominant. No evidence of dissection, stenosis (50% or greater) or occlusion. Skeleton: Negative. Other neck: Negative. Upper chest: Negative. Review of the MIP images confirms the above findings CTA HEAD FINDINGS Anterior circulation: No significant stenosis, proximal occlusion, aneurysm, or vascular malformation. Posterior  circulation: No significant stenosis, proximal occlusion, aneurysm, or vascular malformation. Venous sinuses: As permitted by contrast timing, patent. Anatomic variants: Anterior communicating artery and right posterior communicating arteries are patent. No left posterior communicating artery identified, likely hypoplastic or absent. Delayed phase: No abnormal intracranial enhancement. Review of the MIP images confirms the above findings IMPRESSION: 1. Patent carotid and vertebral arteries. No dissection, aneurysm, or hemodynamically significant stenosis utilizing NASCET criteria. 2. Patent anterior and posterior intracranial circulation. No large vessel occlusion, aneurysm, or significant stenosis. Electronically Signed   By: Mitzi Hansen M.D.   On: 07/10/2018 18:55   Mr Brain Wo Contrast  Result Date: 07/11/2018 CLINICAL DATA:  RIGHT-sided weakness and slurred speech 2 weeks ago, slowly improving. Suspect stroke. EXAM: MRI HEAD WITHOUT CONTRAST TECHNIQUE: Multiplanar, multiecho pulse sequences of the brain and surrounding structures were obtained without intravenous contrast. COMPARISON:  CT HEAD July 10, 2018 FINDINGS: INTRACRANIAL CONTENTS: No reduced diffusion to suggest acute ischemia or hyperacute demyelination. Faint reduced diffusion and normalized ADC values, faint susceptibility fat artifact in T1 shortening associated with the RIGHT cerebellar infarcts. The ventricles and sulci are normal for patient's age. No suspicious parenchymal signal, masses, mass effect. No abnormal extra-axial fluid collections. No extra-axial masses. VASCULAR: Normal major intracranial vascular flow voids present at skull base. SKULL AND UPPER CERVICAL SPINE: No abnormal sellar expansion. No suspicious calvarial bone marrow signal. Craniocervical junction maintained. SINUSES/ORBITS: The mastoid air-cells and included paranasal sinuses are well-aerated.The included ocular globes and orbital contents are  non-suspicious. OTHER: None. IMPRESSION: 1. Subacute RIGHT superior cerebellar artery territory infarcts. 2. Otherwise unremarkable noncontrast MRI head. Electronically Signed   By: Awilda Metro M.D.   On: 07/11/2018 02:27   Vas [place] Transcranial Doppler W Bubbles  Result Date: 07/11/2018  Transcranial Doppler with Bubble Indications: Stroke. History: Methamphetamine use. Performing Technologist: Gertie FeyMichelle Simonetti MHA, RDMS, RVT, RDCS  Examination Guidelines: A complete evaluation includes B-mode imaging, spectral Doppler, color Doppler, and power Doppler as needed of all accessible portions of each vessel. Bilateral testing is considered an integral part of a complete examination. Limited examinations for reoccurring indications may be performed as noted.  Summary:  A vascular evaluation was performed. The right middle cerebral artery was studied. An IV was inserted into the patient's right Forearm. Verbal informed consent was obtained.  HITS (high intensity transient signals) heard at rest and with Valsalva, suggestive of small to medium PFO (patent foramen ovale). *See table(s) above for measurements and observations.    Preliminary    Vas [place] Lower Extremity Venous (dvt)  Result Date: 07/11/2018  Lower Venous Study Indications: Stroke.  Comparison Study: No prior study on file for comparison Performing Technologist: Sherren Kernsandace Kanady RVS  Examination Guidelines: A complete evaluation includes B-mode imaging, spectral Doppler, color Doppler, and power Doppler as needed of all accessible portions of each vessel. Bilateral testing is considered an integral part of a complete examination. Limited examinations for reoccurring indications may be performed as noted.  Right Venous Findings: +---------+---------------+---------+-----------+----------+-------+          CompressibilityPhasicitySpontaneityPropertiesSummary +---------+---------------+---------+-----------+----------+-------+ CFV      Full            Yes      Yes                          +---------+---------------+---------+-----------+----------+-------+ SFJ      Full                                                 +---------+---------------+---------+-----------+----------+-------+ FV Prox  Full                                                 +---------+---------------+---------+-----------+----------+-------+ FV Mid   Full                                                 +---------+---------------+---------+-----------+----------+-------+ FV DistalFull                                                 +---------+---------------+---------+-----------+----------+-------+ PFV      Full                                                 +---------+---------------+---------+-----------+----------+-------+ POP      Full           Yes      Yes                          +---------+---------------+---------+-----------+----------+-------+ PTV  Full                                                 +---------+---------------+---------+-----------+----------+-------+ PERO     Full                                                 +---------+---------------+---------+-----------+----------+-------+  Left Venous Findings: +---------+---------------+---------+-----------+----------+-------+          CompressibilityPhasicitySpontaneityPropertiesSummary +---------+---------------+---------+-----------+----------+-------+ CFV      Full           Yes      Yes                          +---------+---------------+---------+-----------+----------+-------+ SFJ      Full                                                 +---------+---------------+---------+-----------+----------+-------+ FV Prox  Full                                                 +---------+---------------+---------+-----------+----------+-------+ FV Mid   Full                                                  +---------+---------------+---------+-----------+----------+-------+ FV DistalFull                                                 +---------+---------------+---------+-----------+----------+-------+ PFV      Full                                                 +---------+---------------+---------+-----------+----------+-------+ POP      Full           Yes      Yes                          +---------+---------------+---------+-----------+----------+-------+ PTV      Full                                                 +---------+---------------+---------+-----------+----------+-------+ PERO     Full                                                 +---------+---------------+---------+-----------+----------+-------+  Summary: Right: There is no evidence of deep vein thrombosis in the lower extremity. Left: There is no evidence of deep vein thrombosis in the lower extremity.  *See table(s) above for measurements and observations.    Preliminary       Subjective:   Discharge Exam: Vitals:   07/11/18 0828 07/11/18 1316  BP: 122/80 113/79  Pulse: 67 72  Resp: 18 18  Temp: 98.1 F (36.7 C) 97.8 F (36.6 C)  SpO2: 99% 100%   Vitals:   07/11/18 0445 07/11/18 0645 07/11/18 0828 07/11/18 1316  BP: 127/89 125/90 122/80 113/79  Pulse: 61 67 67 72  Resp: 18 18 18 18   Temp:  97.6 F (36.4 C) 98.1 F (36.7 C) 97.8 F (36.6 C)  TempSrc:  Axillary Oral Oral  SpO2: 100% 99% 99% 100%  Weight:      Height:        General: Pt is alert, awake, not in acute distress Cardiovascular: RRR, S1/S2 +, no rubs, no gallops Respiratory: CTA bilaterally, no wheezing, no rhonchi Abdominal: Soft, NT, ND, bowel sounds + Extremities: no edema, no cyanosis    The results of significant diagnostics from this hospitalization (including imaging, microbiology, ancillary and laboratory) are listed below for reference.     Microbiology: No results found for this or any previous  visit (from the past 240 hour(s)).   Labs: BNP (last 3 results) No results for input(s): BNP in the last 8760 hours. Basic Metabolic Panel: Recent Labs  Lab 07/10/18 1328  NA 136  K 3.8  CL 104  CO2 25  GLUCOSE 99  BUN 24*  CREATININE 1.25*  CALCIUM 8.3*   Liver Function Tests: Recent Labs  Lab 07/10/18 1328  AST 33  ALT 51*  ALKPHOS 55  BILITOT 0.6  PROT 7.5  ALBUMIN 3.8   No results for input(s): LIPASE, AMYLASE in the last 168 hours. No results for input(s): AMMONIA in the last 168 hours. CBC: Recent Labs  Lab 07/10/18 1328  WBC 6.9  NEUTROABS 4.4  HGB 12.7*  HCT 40.7  MCV 78.3*  PLT 336   Cardiac Enzymes: Recent Labs  Lab 07/10/18 1328  TROPONINI <0.03   BNP: Invalid input(s): POCBNP CBG: No results for input(s): GLUCAP in the last 168 hours. D-Dimer No results for input(s): DDIMER in the last 72 hours. Hgb A1c Recent Labs    07/11/18 0425  HGBA1C 6.0*   Lipid Profile Recent Labs    07/11/18 0425  CHOL 219*  HDL 51  LDLCALC 147*  TRIG 105  CHOLHDL 4.3   Thyroid function studies Recent Labs    07/11/18 0425  TSH 0.327*   Anemia work up Recent Labs    07/11/18 0425  VITAMINB12 443   Urinalysis No results found for: COLORURINE, APPEARANCEUR, LABSPEC, PHURINE, GLUCOSEU, HGBUR, BILIRUBINUR, KETONESUR, PROTEINUR, UROBILINOGEN, NITRITE, LEUKOCYTESUR Sepsis Labs Invalid input(s): PROCALCITONIN,  WBC,  LACTICIDVEN Microbiology No results found for this or any previous visit (from the past 240 hour(s)).  Please note: You were cared for by a hospitalist during your hospital stay. Once you are discharged, your primary care physician will handle any further medical issues. Please note that NO REFILLS for any discharge medications will be authorized once you are discharged, as it is imperative that you return to your primary care physician (or establish a relationship with a primary care physician if you do not have one) for your post  hospital discharge needs so that they can reassess your need for medications  and monitor your lab values.    Time coordinating discharge: 40 minutes  SIGNED:   Burnadette Pop, MD  [facility] 07/11/2018, 2:14 PM Pager [phone]  If 7PM-7AM, please contact night-coverage [email or web address] Password [email or web address]

## 2018-07-11 NOTE — Evaluation (Signed)
Occupational Therapy Evaluation Patient Details Name: Logan Trevino MRN: [id number] DOB: 12/29/1980 Today's Date: 07/11/2018    History of Present Illness 37 y.o. male, with no significant PMH, presenting with 2 weeks history of right-sided weakness and problems with coordination at work.  He reports slurring of speech and right-sided weakness that has been improving over the last 2 weeks.  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     Clinical Impression   Pt admitted with the above diagnoses and presents with below problem list. Pt will benefit from continued acute OT to address the below listed deficits and maximize independence with basic ADLs prior to d/c home. PTA pt was independent with ADLs, worked as a Manufacturing engineer until recently. Pt presents with impaired coordination of RUE impacting safety and assist level with ADLs. Eval limited due to arrival of transport services.      Follow Up Recommendations  Outpatient OT;Supervision - Intermittent    Equipment Recommendations  None recommended by OT    Recommendations for Other Services PT consult     Precautions / Restrictions Precautions Precautions: Fall Restrictions Weight Bearing Restrictions: No      Mobility Bed Mobility Overal bed mobility: Modified Independent                Transfers Overall transfer level: Needs assistance Equipment used: None Transfers: Sit to/from Stand Sit to Stand: Supervision;Min guard         General transfer comment: stood from EOB, pivotal steps to sit in transport w/c.    Balance Overall balance assessment: Mild deficits observed, not formally tested(limited assessment due to arrival of transport services)                                         ADL either performed or assessed with clinical judgement   ADL Overall ADL's : Needs assistance/impaired Eating/Feeding: Set  up;Sitting;Minimal assistance   Grooming: Moderate assistance   Upper Body Bathing: Sitting;Minimal assistance   Lower Body Bathing: Min guard;Sit to/from stand   Upper Body Dressing : Minimal assistance;Sitting   Lower Body Dressing: Min guard;Sit to/from stand Lower Body Dressing Details (indicate cue type and reason): likely needs some assist with fasteners Toilet Transfer: Min guard   Toileting- Clothing Manipulation and Hygiene: Min guard   Tub/ Shower Transfer: Min guard   Functional mobility during ADLs: Min guard General ADL Comments: Pt completed bed mobility and walked to transport w/c.      Vision Patient Visual Report: No change from baseline       Perception     Praxis      Pertinent Vitals/Pain       Hand Dominance     Extremity/Trunk Assessment Upper Extremity Assessment Upper Extremity Assessment: RUE deficits/detail RUE Deficits / Details: 4/5 gros strength. Impaired FMC and mild gross motor deficits. RUE Sensation: WNL RUE Coordination: decreased fine motor;decreased gross motor   Lower Extremity Assessment Lower Extremity Assessment: Defer to PT evaluation       Communication Communication Communication: No difficulties   Cognition Arousal/Alertness: Awake/alert Behavior During Therapy: WFL for tasks assessed/performed Overall Cognitive Status: Within Functional Limits for tasks assessed  General Comments       Exercises Exercises: Other exercises Other Exercises Other Exercises: Left fine motor coordination handout in room    Shoulder Instructions      Home Living Family/patient expects to be discharged to:: Private residence Living Arrangements: Other relatives Available Help at Discharge: Family   Home Access: Level entry     Home Layout: One level     Bathroom Shower/Tub: Tub/shower unit         Home Equipment: None          Prior Functioning/Environment  Level of Independence: Independent        Comments: Patient works as a Investment banker, 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        OT Problem List: Decreased strength;Decreased activity tolerance;Impaired balance (sitting and/or standing);Decreased coordination;Decreased knowledge of use of DME or AE;Decreased knowledge of precautions;Impaired UE functional use      OT Treatment/Interventions: Self-care/ADL training;Therapeutic exercise;Neuromuscular education;DME and/or AE instruction;Therapeutic activities;Patient/family education;Balance training    OT Goals(Current goals can be found in the care plan section) Acute Rehab OT Goals Patient Stated Goal: not stated Time For Goal Achievement: 07/25/18 Potential to Achieve Goals: Good ADL Goals Pt Will Perform Eating: Independently;sitting Pt Will Perform Grooming: with modified independence;sitting Pt Will Perform Upper Body Dressing: with modified independence;sitting Additional ADL Goal #1: Pt will be independent with [facility] exercises for RUE.  OT Frequency: Min 2X/week   Barriers to D/C:            Co-evaluation              AM-PAC OT "6 Clicks" Daily Activity     Outcome Measure Help from another person eating meals?: A Little Help from another person taking care of personal grooming?: A Little Help from another person toileting, which includes using toliet, bedpan, or urinal?: None Help from another person bathing (including washing, rinsing, drying)?: None Help from another person to put on and taking off regular upper body clothing?: A Little Help from another person to put on and taking off regular lower body clothing?: None 6 Click Score: 21   End of Session    Activity Tolerance: Patient tolerated treatment well Patient left: Other (comment)(in w/c with transport services)  OT Visit Diagnosis: Other (comment);Other abnormalities of gait and mobility  (R26.89);Ataxia, unspecified (R27.0)(impaired coordination of RUE)                Time: 1610-96041058-1106 OT Time Calculation (min): 8 min Charges:  OT General Charges $OT Visit: 1 Visit OT Evaluation $OT Eval Low Complexity: 1 Low  Raynald KempKathryn Nyaire Denbleyker, OT Acute Rehabilitation Services Pager: [phone] Office: [phone]   Pilar GrammesMathews, Jezreel Justiniano H 07/11/2018, 12:20 PM

## 2018-07-11 NOTE — Progress Notes (Signed)
Echocardiogram 2D Echocardiogram has been performed.  07/11/2018 12:14 PM Gertie FeyMichelle Jo-Ann Johanning, MHA, RVT, RDCS, RDMS

## 2018-07-11 NOTE — Progress Notes (Signed)
VASCULAR LAB PRELIMINARY  PRELIMINARY  PRELIMINARY  PRELIMINARY  Bilateral lower extremity venous duplex completed.    Preliminary report:  There is no DVT or SVT noted in the bilateral lower extremities.   Gaius Ishaq, RVT 07/11/2018, 11:29 AM

## 2018-07-11 NOTE — Plan of Care (Signed)
Min assist with adls 

## 2018-07-11 NOTE — Evaluation (Signed)
Physical Therapy Evaluation & Discharge Patient Details Name: Logan Trevino MRN: [id number] DOB: Jul 26, 1981 Today's Date: 07/11/2018   History of Present Illness  37 y.o. male, with no significant PMH, presenting with 2 weeks history of right-sided weakness and problems with coordination at work.  He reports slurring of speech and right-sided weakness that has been improving over the last 2 weeks.  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    Clinical Impression  Patient presents close to functional baseline with mobility and without LE coordination deficits able to demonstrate tandem gait, braiding and tip toe tandem.  Feel symptoms more impairing work due to UE coordination deficits.  OT already recommending outpatient follow up for UE symptoms, but no follow up PT needs at this time.     Follow Up Recommendations No PT follow up    Equipment Recommendations  None recommended by PT    Recommendations for Other Services       Precautions / Restrictions Precautions Precautions: None      Mobility  Bed Mobility Overal bed mobility: Independent                Transfers Overall transfer level: Independent                  Ambulation/Gait Ambulation/Gait assistance: Independent   Assistive device: None Gait Pattern/deviations: WFL(Within Functional Limits)     General Gait Details: no LOB see DGI  Stairs Stairs: Yes Stairs assistance: Independent Stair Management: No rails;Alternating pattern Number of Stairs: 4    Wheelchair Mobility    Modified Rankin (Stroke Patients Only) Modified Rankin (Stroke Patients Only) Pre-Morbid Rankin Score: No symptoms Modified Rankin: No significant disability     Balance Overall balance assessment: Independent   Sitting balance-Leahy Scale: Normal                       High level balance activites: Side stepping;Braiding High Level  Balance Comments: tandem stand x 30 sec R foot in back, 20 sec L foot in back, feet together eyes close x30 sec Standardized Balance Assessment Standardized Balance Assessment : Dynamic Gait Index   Dynamic Gait Index Level Surface: Normal Change in Gait Speed: Normal Gait with Horizontal Head Turns: Normal Gait with Vertical Head Turns: Normal Gait and Pivot Turn: Normal Step Over Obstacle: Normal Step Around Obstacles: Normal Steps: Normal Total Score: 24       Pertinent Vitals/Pain Pain Assessment: No/denies pain    Home Living Family/patient expects to be discharged to:: Private residence Living Arrangements: Non-relatives/Friends Available Help at Discharge: Family Type of Home: House Home Access: Level entry(from front)     Home Layout: One level Home Equipment: None      Prior Function Level of Independence: Independent         Comments: Patient works as a Investment banker, 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     Hand Dominance        Extremity/Trunk Assessment   Upper Extremity Assessment Upper Extremity Assessment: Defer to OT evaluation    Lower Extremity Assessment Lower Extremity Assessment: Overall WFL for tasks assessed(coord intact to toe taps and heel to shin)       Communication   Communication: No difficulties  Cognition Arousal/Alertness: Awake/alert Behavior During Therapy: WFL for tasks assessed/performed Overall Cognitive Status: Within Functional Limits for tasks assessed  General Comments      Exercises     Assessment/Plan    PT Assessment Patent does not need any further PT services  PT Problem List         PT Treatment Interventions      PT Goals (Current goals can be found in the Care Plan section)  Acute Rehab PT Goals PT Goal Formulation: All assessment and education complete, DC therapy    Frequency      Barriers to discharge        Co-evaluation               AM-PAC PT "6 Clicks" Mobility  Outcome Measure Help needed turning from your back to your side while in a flat bed without using bedrails?: None Help needed moving from lying on your back to sitting on the side of a flat bed without using bedrails?: None Help needed moving to and from a bed to a chair (including a wheelchair)?: None Help needed standing up from a chair using your arms (e.g., wheelchair or bedside chair)?: None Help needed to walk in hospital room?: None Help needed climbing 3-5 steps with a railing? : None 6 Click Score: 24    End of Session Equipment Utilized During Treatment: Gait belt Activity Tolerance: Patient tolerated treatment well Patient left: in bed;with call bell/phone within reach;with nursing/sitter in room;with family/visitor present   PT Visit Diagnosis: Other symptoms and signs involving the nervous system (R29.898)    Time: [id number] PT Time Calculation (min) (ACUTE ONLY): 33 min   Charges:   PT Evaluation $PT Eval Low Complexity: 1 Low PT Treatments $Gait Training: 8-22 mins        Sheran LawlessCyndi Wilmer [place], PT Acute Rehabilitation Services [phone] 07/11/2018   Elray Mcgregorynthia Sondos Wolfman 07/11/2018, 5:29 PM

## 2018-07-11 NOTE — Progress Notes (Addendum)
STROKE TEAM PROGRESS NOTE   HISTORY OF PRESENT ILLNESS (per record) Logan Trevino is a 37 y.o. male with no significant past medical history. 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 Approximately 2 weeks ago he noted that he was clumsy with his right hand and had problems with coordination.  He also noted that he was having difficulty with his speech i.e. slurring of speech-off and on.  He states he did not come to the hospital because he thought he had slept on his arm the wrong way. 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   SUBJECTIVE (INTERVAL HISTORY) Unable to get TEE done until Monday. Pt wants to leave. Only deficit remains right hand mine motor incoordination. TEE and cardiology f/u will be set up as an out pt.   ROS: ROS was performed and is negative except as noted in the HPI.   OBJECTIVE Vitals:   07/11/18 0445 07/11/18 0645 07/11/18 0828 07/11/18 1316  BP: 127/89 125/90 122/80 113/79  Pulse: 61 67 67 72  Resp: 18 18 18 18   Temp:  97.6 F (36.4 C) 98.1 F (36.7 C) 97.8 F (36.6 C)  TempSrc:  Axillary Oral Oral  SpO2: 100% 99% 99% 100%  Weight:      Height:        CBC:  Recent Labs  Lab 07/10/18 1328  WBC 6.9  NEUTROABS 4.4  HGB 12.7*  HCT 40.7  MCV 78.3*  PLT 336    Basic Metabolic Panel:  Recent Labs  Lab 07/10/18 1328  NA 136  K 3.8  CL 104  CO2 25  GLUCOSE 99  BUN 24*  CREATININE 1.25*  CALCIUM 8.3*    Lipid Panel:     Component Value Date/Time   CHOL 219 (H) 07/11/2018 0425   TRIG 105 07/11/2018 0425   HDL 51 07/11/2018 0425   CHOLHDL 4.3 07/11/2018 0425   VLDL 21 07/11/2018 0425   LDLCALC 147 (H) 07/11/2018 0425   HgbA1c:  Lab Results  Component Value Date   HGBA1C 6.0 (H) 07/11/2018   Urine Drug Screen:     Component Value Date/Time   LABOPIA NONE  DETECTED 07/11/2018 0301   COCAINSCRNUR NONE DETECTED 07/11/2018 0301   LABBENZ NONE DETECTED 07/11/2018 0301   AMPHETMU POSITIVE (A) 07/11/2018 0301   THCU NONE DETECTED 07/11/2018 0301   LABBARB NONE DETECTED 07/11/2018 0301    Alcohol Level No results found for: ETH  IMAGING Reviewed:   Ct Angio Head W Or Wo Contrast  Result Date: 07/10/2018 CLINICAL DATA:  37 y/o M; headache, slurred speech, and right-sided numbness with weakness 2 weeks ago. EXAM: CT ANGIOGRAPHY HEAD AND NECK TECHNIQUE: Multidetector CT imaging of the head and neck was performed using the standard protocol during bolus administration of intravenous contrast. Multiplanar CT image reconstructions and MIPs were obtained to evaluate the vascular anatomy. Carotid stenosis measurements (when applicable) are obtained utilizing NASCET criteria, using the distal internal carotid diameter as the denominator. CONTRAST:  75 cc Isovue 370 COMPARISON:  07/10/2018 CT head FINDINGS: CTA NECK FINDINGS Aortic arch: Standard branching. Imaged portion shows no evidence of aneurysm or dissection. No significant stenosis of the major arch vessel origins. Right carotid system: No evidence of dissection, stenosis (50% or greater) or occlusion. Left carotid system: No evidence of dissection,  stenosis (50% or greater) or occlusion. Vertebral arteries: Codominant. No evidence of dissection, stenosis (50% or greater) or occlusion. Skeleton: Negative. Other neck: Negative. Upper chest: Negative. Review of the MIP images confirms the above findings CTA HEAD FINDINGS Anterior circulation: No significant stenosis, proximal occlusion, aneurysm, or vascular malformation. Posterior circulation: No significant stenosis, proximal occlusion, aneurysm, or vascular malformation. Venous sinuses: As permitted by contrast timing, patent. Anatomic variants: Anterior communicating artery and right posterior communicating arteries are patent. No left posterior communicating  artery identified, likely hypoplastic or absent. Delayed phase: No abnormal intracranial enhancement. Review of the MIP images confirms the above findings IMPRESSION: 1. Patent carotid and vertebral arteries. No dissection, aneurysm, or hemodynamically significant stenosis utilizing NASCET criteria. 2. Patent anterior and posterior intracranial circulation. No large vessel occlusion, aneurysm, or significant stenosis. Electronically Signed   By: Mitzi Hansen M.D.   On: 07/10/2018 18:55   Ct Head Wo Contrast  Result Date: 07/10/2018 CLINICAL DATA:  Initial evaluation for acute headache, slurred speech, right-sided facial numbness and weakness. EXAM: CT HEAD WITHOUT CONTRAST TECHNIQUE: Contiguous axial images were obtained from the base of the skull through the vertex without intravenous contrast. COMPARISON:  None. FINDINGS: Brain: Cerebral volume within normal limits for age. No acute intracranial hemorrhage. There is abnormal which a parenchymal hypodensity involving the superior aspect of the right cerebellar hemisphere, consistent with acute to early subacute ischemic infarct, right SCA territory (series 4, image 47). Additionally, there is question of subtle evolving hypodensity within the anterior left temporal lobe, which could reflect evolving acute ischemia as well (series 2, image 10). No associated mass effect or hemorrhage. No other definite acute large vessel territory infarct. No mass lesion, midline shift or mass effect. No hydrocephalus. No extra-axial fluid collection. Vascular: Question subtle asymmetric hyperdensity within proximal left M2 branch at the base of the left sylvian fissure (series 2, image 12). No other appreciable abnormal hyperdense vessel. Skull: Scalp soft tissues demonstrate no acute abnormality. Calvarium intact. Sinuses/Orbits: Globes and orbital soft tissues within normal limits. Visualized paranasal sinuses are clear. No mastoid effusion. IMPRESSION: 1.  Abnormal hypodensity involving the superior right cerebellar hemisphere, consistent with acute to early subacute ischemic infarct, right superior cerebellar artery territory. 2. Question subtle evolving hypodensity within the anterior left temporal lobe, which could also reflect evolving acute ischemic change. 3. Question subtle asymmetric hyperdensity involving proximal left M2 branch as above. Further assessment with dedicated CTA of the head and neck suggested for further evaluation. 4. No intracranial hemorrhage. Critical Value/emergent results were called by telephone at the time of interpretation on 07/10/2018 at 3:15 pm to Dr. Jacalyn Lefevre , who verbally acknowledged these results. Electronically Signed   By: Rise Mu M.D.   On: 07/10/2018 15:20   Ct Angio Neck W And/or Wo Contrast  Result Date: 07/10/2018 CLINICAL DATA:  37 y/o M; headache, slurred speech, and right-sided numbness with weakness 2 weeks ago. EXAM: CT ANGIOGRAPHY HEAD AND NECK TECHNIQUE: Multidetector CT imaging of the head and neck was performed using the standard protocol during bolus administration of intravenous contrast. Multiplanar CT image reconstructions and MIPs were obtained to evaluate the vascular anatomy. Carotid stenosis measurements (when applicable) are obtained utilizing NASCET criteria, using the distal internal carotid diameter as the denominator. CONTRAST:  75 cc Isovue 370 COMPARISON:  07/10/2018 CT head FINDINGS: CTA NECK FINDINGS Aortic arch: Standard branching. Imaged portion shows no evidence of aneurysm or dissection. No significant stenosis of the major arch vessel origins. Right carotid system:  No evidence of dissection, stenosis (50% or greater) or occlusion. Left carotid system: No evidence of dissection, stenosis (50% or greater) or occlusion. Vertebral arteries: Codominant. No evidence of dissection, stenosis (50% or greater) or occlusion. Skeleton: Negative. Other neck: Negative. Upper chest:  Negative. Review of the MIP images confirms the above findings CTA HEAD FINDINGS Anterior circulation: No significant stenosis, proximal occlusion, aneurysm, or vascular malformation. Posterior circulation: No significant stenosis, proximal occlusion, aneurysm, or vascular malformation. Venous sinuses: As permitted by contrast timing, patent. Anatomic variants: Anterior communicating artery and right posterior communicating arteries are patent. No left posterior communicating artery identified, likely hypoplastic or absent. Delayed phase: No abnormal intracranial enhancement. Review of the MIP images confirms the above findings IMPRESSION: 1. Patent carotid and vertebral arteries. No dissection, aneurysm, or hemodynamically significant stenosis utilizing NASCET criteria. 2. Patent anterior and posterior intracranial circulation. No large vessel occlusion, aneurysm, or significant stenosis. Electronically Signed   By: Mitzi Hansen M.D.   On: 07/10/2018 18:55   Mr Brain Wo Contrast  Result Date: 07/11/2018 CLINICAL DATA:  RIGHT-sided weakness and slurred speech 2 weeks ago, slowly improving. Suspect stroke. EXAM: MRI HEAD WITHOUT CONTRAST TECHNIQUE: Multiplanar, multiecho pulse sequences of the brain and surrounding structures were obtained without intravenous contrast. COMPARISON:  CT HEAD July 10, 2018 FINDINGS: INTRACRANIAL CONTENTS: No reduced diffusion to suggest acute ischemia or hyperacute demyelination. Faint reduced diffusion and normalized ADC values, faint susceptibility fat artifact in T1 shortening associated with the RIGHT cerebellar infarcts. The ventricles and sulci are normal for patient's age. No suspicious parenchymal signal, masses, mass effect. No abnormal extra-axial fluid collections. No extra-axial masses. VASCULAR: Normal major intracranial vascular flow voids present at skull base. SKULL AND UPPER CERVICAL SPINE: No abnormal sellar expansion. No suspicious calvarial bone  marrow signal. Craniocervical junction maintained. SINUSES/ORBITS: The mastoid air-cells and included paranasal sinuses are well-aerated.The included ocular globes and orbital contents are non-suspicious. OTHER: None. IMPRESSION: 1. Subacute RIGHT superior cerebellar artery territory infarcts. 2. Otherwise unremarkable noncontrast MRI head. Electronically Signed   By: Awilda Metro M.D.   On: 07/11/2018 02:27   Vas [place] Transcranial Doppler W Bubbles  Result Date: 07/11/2018  Transcranial Doppler with Bubble Indications: Stroke. History: Methamphetamine use. Performing Technologist: Gertie Fey MHA, RDMS, RVT, RDCS  Examination Guidelines: A complete evaluation includes B-mode imaging, spectral Doppler, color Doppler, and power Doppler as needed of all accessible portions of each vessel. Bilateral testing is considered an integral part of a complete examination. Limited examinations for reoccurring indications may be performed as noted.  Summary:  A vascular evaluation was performed. The right middle cerebral artery was studied. An IV was inserted into the patient's right Forearm. Verbal informed consent was obtained.  HITS (high intensity transient signals) heard at rest and with Valsalva, suggestive of small to medium PFO (patent foramen ovale). *See table(s) above for measurements and observations.    Preliminary    Vas [place] Lower Extremity Venous (dvt)  Result Date: 07/11/2018  Lower Venous Study Indications: Stroke.  Comparison Study: No prior study on file for comparison Performing Technologist: Sherren Kerns RVS  Examination Guidelines: A complete evaluation includes B-mode imaging, spectral Doppler, color Doppler, and power Doppler as needed of all accessible portions of each vessel. Bilateral testing is considered an integral part of a complete examination. Limited examinations for reoccurring indications may be performed as noted.  Right Venous Findings:  +---------+---------------+---------+-----------+----------+-------+          CompressibilityPhasicitySpontaneityPropertiesSummary +---------+---------------+---------+-----------+----------+-------+ CFV      Full  Yes      Yes                          +---------+---------------+---------+-----------+----------+-------+ SFJ      Full                                                 +---------+---------------+---------+-----------+----------+-------+ FV Prox  Full                                                 +---------+---------------+---------+-----------+----------+-------+ FV Mid   Full                                                 +---------+---------------+---------+-----------+----------+-------+ FV DistalFull                                                 +---------+---------------+---------+-----------+----------+-------+ PFV      Full                                                 +---------+---------------+---------+-----------+----------+-------+ POP      Full           Yes      Yes                          +---------+---------------+---------+-----------+----------+-------+ PTV      Full                                                 +---------+---------------+---------+-----------+----------+-------+ PERO     Full                                                 +---------+---------------+---------+-----------+----------+-------+  Left Venous Findings: +---------+---------------+---------+-----------+----------+-------+          CompressibilityPhasicitySpontaneityPropertiesSummary +---------+---------------+---------+-----------+----------+-------+ CFV      Full           Yes      Yes                          +---------+---------------+---------+-----------+----------+-------+ SFJ      Full                                                  +---------+---------------+---------+-----------+----------+-------+ FV Prox  Full                                                 +---------+---------------+---------+-----------+----------+-------+  FV Mid   Full                                                 +---------+---------------+---------+-----------+----------+-------+ FV DistalFull                                                 +---------+---------------+---------+-----------+----------+-------+ PFV      Full                                                 +---------+---------------+---------+-----------+----------+-------+ POP      Full           Yes      Yes                          +---------+---------------+---------+-----------+----------+-------+ PTV      Full                                                 +---------+---------------+---------+-----------+----------+-------+ PERO     Full                                                 +---------+---------------+---------+-----------+----------+-------+    Summary: Right: There is no evidence of deep vein thrombosis in the lower extremity. Left: There is no evidence of deep vein thrombosis in the lower extremity.  *See table(s) above for measurements and observations.    Preliminary      Transthoracic Echocardiogram -EF 50% with hypokinesis and + PFO  PHYSICAL EXAM Blood pressure 113/79, pulse 72, temperature 97.8 F (36.6 C), temperature source Oral, resp. rate 18, height 5\' 7"  (1.702 m), weight 52.2 kg, SpO2 100 %.  Physical Exam  Constitutional: Appears well-developed and well-nourished.  Psych: Affect appropriate to situation Eyes: No scleral injection HENT: No OP obstrucion Head: Normocephalic.  Cardiovascular: Normal rate and regular rhythm.  Respiratory: Effort normal, non-labored breathing GI: Soft.  No distension. There is no tenderness.  Skin: WDI  Neuro: Mental Status: Patient is awake, alert, oriented to person,  place, month, year, and situation. Patient is able to give a clear and coherent history. No signs of aphasia or neglect however he is mildly dysarthric Cranial Nerves: II: Visual Fields are full.  III,IV, VI: EOMI without ptosis or diploplia. Pupils are equal, round, and reactive to light.   V: Facial sensation is symmetric to temperature VII: Facial movement is symmetric.  VIII: hearing is intact to voice X: Uvula elevates symmetrically XI: Shoulder shrug is symmetric. XII: tongue is midline without atrophy or fasciculations.  Motor: Tone is normal. Bulk is normal. 5/5 strength on the left arm and bilateral legs.  He is a 4+/5 on the right arm Sensory: Sensation is symmetric to light touch and temperature in the arms and legs. Deep Tendon Reflexes:  2+ and symmetric in the biceps and patellae.  Plantars: Toes are downgoing bilaterally.  Cerebellar: Heel-to-shin was within normal limits b/l, left finger-nose was within normal limits, right finger-nose showed dysmetria NIH 2     HOME MEDICATIONS:  No medications prior to admission.      HOSPITAL MEDICATIONS:  . aspirin  300 mg Rectal Daily   Or  . aspirin  325 mg Oral Daily  . atorvastatin  40 mg Oral q1800  . enoxaparin (LOVENOX) injection  40 mg Subcutaneous Q24H    ALLERGIES No Known Allergies  ASSESSMENT/PLAN 37 year old male with a subacute Right SAC stroke (symptoms for 2-weeks) in setting of Methamphetamine use. Echo showed PFO and TCD of RMCA showed + bubble study . LE US neg for DVT.   Stroke of other etiology d/t meth use; cannot r/o embolic source d/t PFO. Out pt cardiology f/u will be arranged per primary team  MRI head - Right SCA infarct  CTA H&N - no significant stenosis  2D Echo -PFO+  LDL - 147  HgbA1c - wnl  UDS - +METH  VTE prophylaxis -Lovenox  Diet as tol; reg  No meds prior to admission, now on ASA + Plavix x 3 weeks, then ASA only  Patient counseled to be compliant with his  antithrombotic medications  Ongoing aggressive stroke risk factor management  Therapy recommendations:home with out pt OT  Disposition:  Home w/out pt services  Hypertension  Stable . Long-term BP goal normotensive  Hyperlipidemia  Lipid lowering medication PTA:  none  LDL 147, goal < 70  Current lipid lowering medication:Lipitor 4mg  PO daily  Continue statin at discharge  Other Stroke Risk Factors  Cigarette smoker; counseled/advised to stop smoking  ETOH use, advised to drink no more than 1-2 drink(s) a day  Counseled on quitting meth and all illicit drugs  Hospital day # 2  I have discussed the above with the primary team Ok to d/c today home with out pt OT/PT for right hand clumbsiness He will need further out pt cardiology wk up for PFO Stroke education and counseling done at bedside Secondary stroke prevention: statin and DAPT x 3 weeks, then just asa daily We will s/o at this time. Please call back if needed.   ATTENDING NOTE: I reviewed above note and agree with the assessment and plan. Pt was seen and examined.   37 year old male with history of vaping nicotine and methamphetamine use admitted for right hand clumsiness, discoordination and on and off slurred speech.  CT showed right cerebellum old stroke, question of left temporal MCA infarct.  However MRI showed right cerebellum SCA territory subacute infarct, but no left MCA infarct.  CT head and neck unremarkable.  2D echo EF 50%, no vegetation.  LE venous Doppler negative.  TCD bubble study showed small PFO at rest, but moderate PFO on Valsalva.  UDS showed positive for amphetamine.  A1c 6.0.  LDL 147.  TSH 0.327 but FT4 normal.  B12 within normal range.  Hypercoagulable and autoimmune work-up pending.   On examination, patient awake alert, orientated x3.  Mild psychomotor slowing.  Cranial nerve intact.  Right hand dexterity difficulty, but finger-to-nose intact.  Right lower extremity 5/5, no ataxia.   Otherwise neuro intact.  Patient stroke could be small to large vessel atherosclerosis given risk factor of hyperlipidemia, methamphetamine use and nicotine abuse.  However, given his young age, PFO present, and occupation as Investment banker, operationalchef with frequent carrying and lifting, will refer to Dr. Excell Seltzerooper cardiologist for  possible consideration of PFO closure.  However patient has no insurance which may be a hurdle for his outpatient procedure.  Anyways, will recommend TEE as outpatient.  Neurology will sign off. Please call with questions. Pt will follow up with stroke clinic NP at [facility] in about 4 weeks. Thanks for the consult.  Marvel Plan, MD PhD Stroke Neurology 07/11/2018 10:15 PM   To contact Stroke Continuity provider, please refer to [email or web address]. After hours, contact General Neurology

## 2018-07-12 LAB — ANCA TITERS
Atypical P-ANCA titer: <1:20 {titer}
C-ANCA: <1:20 {titer}
P-ANCA: <1:20 {titer}

## 2018-07-12 LAB — BETA-2-GLYCOPROTEIN I ABS, IGG/M/A
Beta-2 Glyco I IgG: <9 GPI IgG units (ref 0–20)
Beta-2-Glycoprotein I IgA: <9 GPI IgA units (ref 0–25)
Beta-2-Glycoprotein I IgM: <9 GPI IgM units (ref 0–32)

## 2018-07-12 LAB — ANTINUCLEAR ANTIBODIES, IFA: ANA Ab, IFA: NEGATIVE

## 2018-07-12 LAB — ANTI-DNA ANTIBODY, DOUBLE-STRANDED: ds DNA Ab: <1 IU/mL (ref 0–9)

## 2018-07-13 LAB — MPO/PR-3 (ANCA) ANTIBODIES
ANCA Proteinase 3: <3.5 U/mL (ref 0.0–3.5)
Myeloperoxidase Abs: <9 U/mL (ref 0.0–9.0)

## 2018-07-13 LAB — PROTEIN C, TOTAL: Protein C, Total: 74 % (ref 60–150)

## 2018-07-15 LAB — FACTOR 5 LEIDEN

## 2018-07-16 LAB — PROTHROMBIN GENE MUTATION

## 2018-07-23 ENCOUNTER — Ambulatory Visit: Payer: Self-pay | Admitting: Family Medicine

## 2018-07-29 ENCOUNTER — Telehealth: Payer: Self-pay

## 2018-07-29 NOTE — Telephone Encounter (Signed)
-----   Message from Iona CoachLauren W Brown, RN sent at 07/16/2018 11:37 AM EST ----- Regarding: FW: PFO FYI--I just left this pt a message to call me about making apt with [facility].  Lauren  ----- Message ----- From: Tonny Bollmanooper, Michael, MD Sent: 07/12/2018  10:06 AM EST To: Iona CoachLauren W Brown, RN, Marvel PlanJindong Xu, MD, # Subject: RE: PFO                                        Sounds good. Thanks Jindong. I'll set up an outpatient consult. Will try to work out the Risk managerinsurance piece.   Kathlene NovemberMike ----- Message ----- From: Marvel PlanXu, Jindong, MD Sent: 07/11/2018  10:25 PM EST To: Tonny BollmanMichael Cooper, MD Subject: PFO                                            Hi, Kathlene NovemberMike:  Have one pt, young 37 yo, with cerebellar stroke could be atherosclerosis given risk factor of hyperlipidemia, methamphetamine use and nicotine vaping.  However, given his young age, PFO present on TCD bubble and regular TTE, and occupation as Investment banker, operationalchef with frequent carrying and lifting, will like to see what you think of PFO closure.  However he has no insurance which may be a hurdle for his outpatient procedure (social worker is working on getting coverage).  Anyways, I have recommended TEE as outpatient. Thanks.   Jindong

## 2018-07-29 NOTE — Telephone Encounter (Signed)
Left message to call back to arrange consult with Dr. Cooper. 

## 2018-08-08 NOTE — Telephone Encounter (Signed)
Left message to call back  

## 2018-08-19 NOTE — Telephone Encounter (Signed)
Left message to call back  

## 2018-08-30 NOTE — Telephone Encounter (Signed)
Left message to call back if he wishes to schedule consult with Dr. Excell Seltzer.  Letter mailed to address on file as well.

## 2019-09-24 IMAGING — CT CT ANGIO NECK
2 of 8 series · 8 of 36 positions shown · IV contrast (OMNI 350)
Comparison: 07/10/2018 CT head

CLINICAL DATA: 37 y/o M; headache, slurred speech, and right-sided
numbness with weakness 2 weeks ago.

EXAM:
CT ANGIOGRAPHY HEAD AND NECK
TECHNIQUE: Multidetector CT imaging of the head and neck was performed using
the standard protocol during bolus administration of intravenous
contrast. Multiplanar CT image reconstructions and MIPs were
obtained to evaluate the vascular anatomy. Carotid stenosis
measurements (when applicable) are obtained utilizing NASCET
criteria, using the distal internal carotid diameter as the
denominator.
CONTRAST:  75 cc Isovue 370

[Series 8: cta neck axial · axial · 0.46mm/px · z∈[-351,-86]mm · 6 of 383 slices shown]
[im 55/383  soft-tissue]
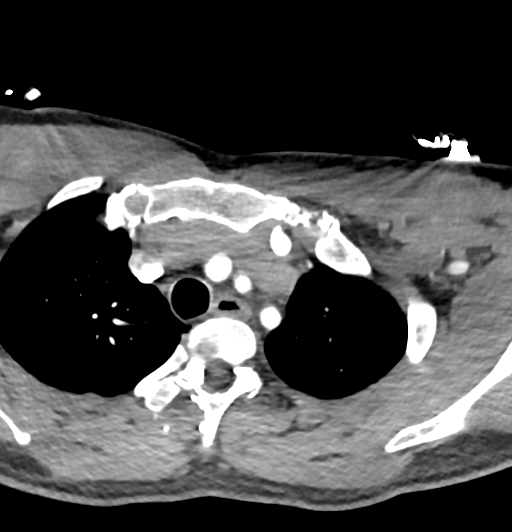
[im 110/383  bone]
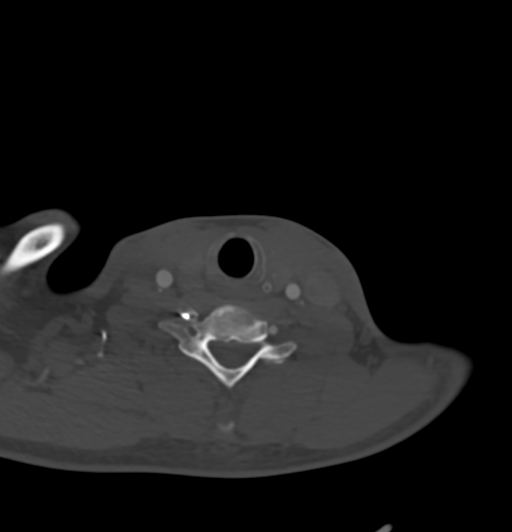
[im 164/383  soft-tissue]
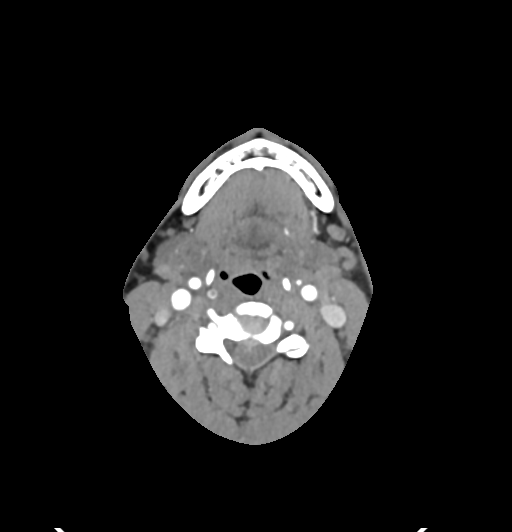
[im 219/383  bone]
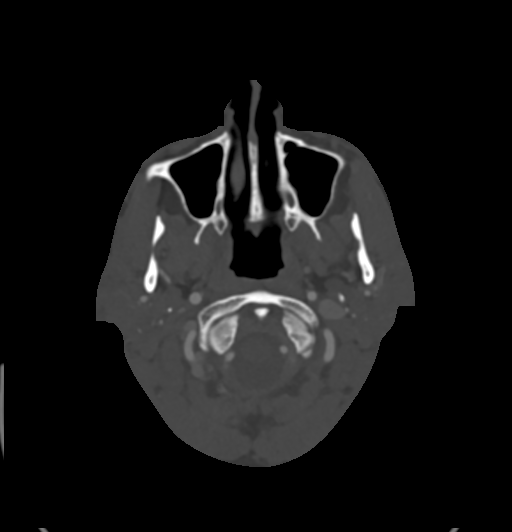
[im 273/383  soft-tissue]
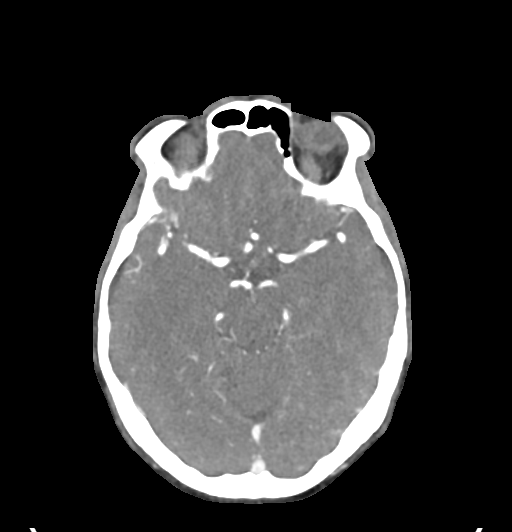
[im 328/383  bone]
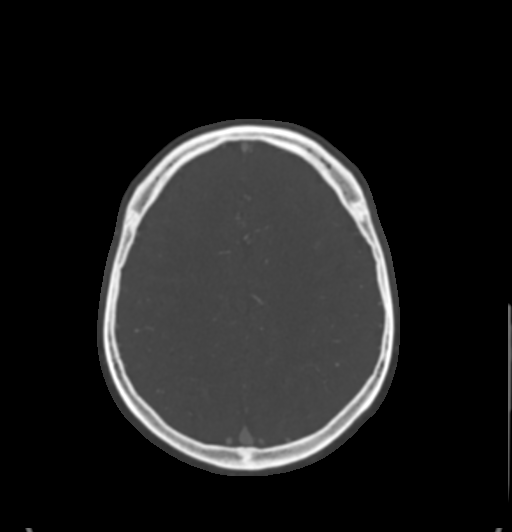

[Series 10: cta neck sagittal · sagittal · 0.48mm/px · 2 of 236 slices shown]
[im 49/236  soft-tissue]
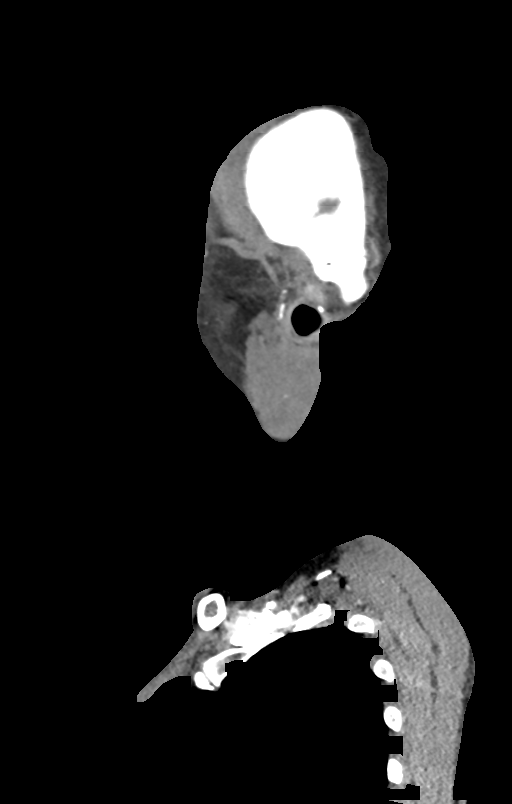
[im 188/236  soft-tissue]
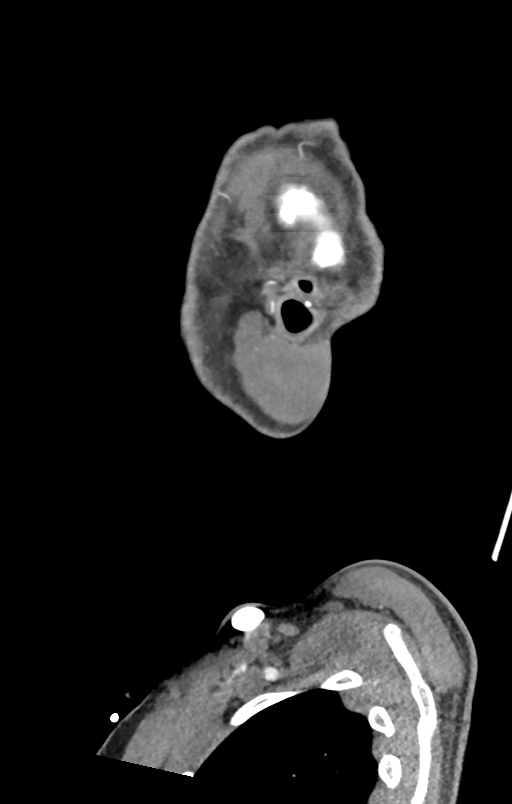

[8 of 36 positions shown; findings below may reference images not displayed]

FINDINGS: CTA NECK FINDINGS

Aortic arch: Standard branching. Imaged portion shows no evidence of
aneurysm or dissection. No significant stenosis of the major arch
vessel origins.

Right carotid system: No evidence of dissection, stenosis (50% or
greater) or occlusion.

Left carotid system: No evidence of dissection, stenosis (50% or
greater) or occlusion.

Vertebral arteries: Codominant. No evidence of dissection, stenosis
(50% or greater) or occlusion.

Skeleton: Negative.

Other neck: Negative.

Upper chest: Negative.

Review of the MIP images confirms the above findings

CTA HEAD FINDINGS

Anterior circulation: No significant stenosis, proximal occlusion,
aneurysm, or vascular malformation.

Posterior circulation: No significant stenosis, proximal occlusion,
aneurysm, or vascular malformation.

Venous sinuses: As permitted by contrast timing, patent.

Anatomic variants: Anterior communicating artery and right posterior
communicating arteries are patent. No left posterior communicating
artery identified, likely hypoplastic or absent.

Delayed phase: No abnormal intracranial enhancement.

Review of the MIP images confirms the above findings
IMPRESSION: 1. Patent carotid and vertebral arteries. No dissection, aneurysm,
or hemodynamically significant stenosis utilizing NASCET criteria.
2. Patent anterior and posterior intracranial circulation. No large
vessel occlusion, aneurysm, or significant stenosis.
